# Patient Record
Sex: Female | Born: 2000 | Race: White | Hispanic: No | Marital: Single | Smoking: Never smoker
Health system: Southern US, Community
[De-identification: ages and names within clinical notes are randomized; demographics above are authoritative.]

## PROBLEM LIST (undated history)

## (undated) DIAGNOSIS — R51 Headache: Secondary | ICD-10-CM

## (undated) HISTORY — DX: Headache: R51

---
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
## 2020-03-20 DIAGNOSIS — F39 Unspecified mood [affective] disorder: Secondary | ICD-10-CM | POA: Diagnosis not present

## 2020-03-20 DIAGNOSIS — Z6831 Body mass index (BMI) 31.0-31.9, adult: Secondary | ICD-10-CM | POA: Diagnosis not present

## 2020-03-20 DIAGNOSIS — Z309 Encounter for contraceptive management, unspecified: Secondary | ICD-10-CM | POA: Diagnosis not present

## 2020-03-20 DIAGNOSIS — Z0000 Encounter for general adult medical examination without abnormal findings: Secondary | ICD-10-CM | POA: Diagnosis not present

## 2020-04-16 DIAGNOSIS — F411 Generalized anxiety disorder: Secondary | ICD-10-CM | POA: Diagnosis not present

## 2020-04-23 DIAGNOSIS — F411 Generalized anxiety disorder: Secondary | ICD-10-CM | POA: Diagnosis not present

## 2020-04-30 DIAGNOSIS — F411 Generalized anxiety disorder: Secondary | ICD-10-CM | POA: Diagnosis not present

## 2020-05-07 DIAGNOSIS — F411 Generalized anxiety disorder: Secondary | ICD-10-CM | POA: Diagnosis not present

## 2020-05-16 DIAGNOSIS — F411 Generalized anxiety disorder: Secondary | ICD-10-CM | POA: Diagnosis not present

## 2020-05-21 DIAGNOSIS — F411 Generalized anxiety disorder: Secondary | ICD-10-CM | POA: Diagnosis not present

## 2020-05-29 DIAGNOSIS — F411 Generalized anxiety disorder: Secondary | ICD-10-CM | POA: Diagnosis not present

## 2020-06-09 DIAGNOSIS — F411 Generalized anxiety disorder: Secondary | ICD-10-CM | POA: Diagnosis not present

## 2020-06-12 DIAGNOSIS — F411 Generalized anxiety disorder: Secondary | ICD-10-CM | POA: Diagnosis not present

## 2020-06-19 DIAGNOSIS — F411 Generalized anxiety disorder: Secondary | ICD-10-CM | POA: Diagnosis not present

## 2020-07-03 DIAGNOSIS — F411 Generalized anxiety disorder: Secondary | ICD-10-CM | POA: Diagnosis not present

## 2020-07-10 DIAGNOSIS — F411 Generalized anxiety disorder: Secondary | ICD-10-CM | POA: Diagnosis not present

## 2020-07-16 DIAGNOSIS — F411 Generalized anxiety disorder: Secondary | ICD-10-CM | POA: Diagnosis not present

## 2020-07-23 DIAGNOSIS — F411 Generalized anxiety disorder: Secondary | ICD-10-CM | POA: Diagnosis not present

## 2020-07-30 DIAGNOSIS — F411 Generalized anxiety disorder: Secondary | ICD-10-CM | POA: Diagnosis not present

## 2020-08-06 DIAGNOSIS — F411 Generalized anxiety disorder: Secondary | ICD-10-CM | POA: Diagnosis not present

## 2020-08-15 DIAGNOSIS — F411 Generalized anxiety disorder: Secondary | ICD-10-CM | POA: Diagnosis not present

## 2020-08-21 DIAGNOSIS — F411 Generalized anxiety disorder: Secondary | ICD-10-CM | POA: Diagnosis not present

## 2020-08-27 DIAGNOSIS — F411 Generalized anxiety disorder: Secondary | ICD-10-CM | POA: Diagnosis not present

## 2020-09-03 DIAGNOSIS — F411 Generalized anxiety disorder: Secondary | ICD-10-CM | POA: Diagnosis not present

## 2020-09-18 DIAGNOSIS — F411 Generalized anxiety disorder: Secondary | ICD-10-CM | POA: Diagnosis not present

## 2020-10-02 DIAGNOSIS — F411 Generalized anxiety disorder: Secondary | ICD-10-CM | POA: Diagnosis not present

## 2020-10-17 DIAGNOSIS — F330 Major depressive disorder, recurrent, mild: Secondary | ICD-10-CM | POA: Diagnosis not present

## 2020-10-19 DIAGNOSIS — F411 Generalized anxiety disorder: Secondary | ICD-10-CM | POA: Diagnosis not present

## 2020-10-31 DIAGNOSIS — F411 Generalized anxiety disorder: Secondary | ICD-10-CM | POA: Diagnosis not present

## 2020-11-07 DIAGNOSIS — Z6830 Body mass index (BMI) 30.0-30.9, adult: Secondary | ICD-10-CM | POA: Diagnosis not present

## 2020-11-07 DIAGNOSIS — F419 Anxiety disorder, unspecified: Secondary | ICD-10-CM | POA: Diagnosis not present

## 2020-11-07 DIAGNOSIS — F330 Major depressive disorder, recurrent, mild: Secondary | ICD-10-CM | POA: Diagnosis not present

## 2020-11-14 DIAGNOSIS — F411 Generalized anxiety disorder: Secondary | ICD-10-CM | POA: Diagnosis not present

## 2020-11-26 DIAGNOSIS — F411 Generalized anxiety disorder: Secondary | ICD-10-CM | POA: Diagnosis not present

## 2020-12-10 DIAGNOSIS — F411 Generalized anxiety disorder: Secondary | ICD-10-CM | POA: Diagnosis not present

## 2020-12-24 DIAGNOSIS — F411 Generalized anxiety disorder: Secondary | ICD-10-CM | POA: Diagnosis not present

## 2021-01-14 DIAGNOSIS — F411 Generalized anxiety disorder: Secondary | ICD-10-CM | POA: Diagnosis not present

## 2021-03-11 DIAGNOSIS — F419 Anxiety disorder, unspecified: Secondary | ICD-10-CM | POA: Diagnosis not present

## 2021-03-11 DIAGNOSIS — Z23 Encounter for immunization: Secondary | ICD-10-CM | POA: Diagnosis not present

## 2021-03-11 DIAGNOSIS — F330 Major depressive disorder, recurrent, mild: Secondary | ICD-10-CM | POA: Diagnosis not present

## 2021-04-12 DIAGNOSIS — R059 Cough, unspecified: Secondary | ICD-10-CM | POA: Diagnosis not present

## 2021-04-12 DIAGNOSIS — J9801 Acute bronchospasm: Secondary | ICD-10-CM | POA: Diagnosis not present

## 2021-04-12 DIAGNOSIS — J209 Acute bronchitis, unspecified: Secondary | ICD-10-CM | POA: Diagnosis not present

## 2021-09-09 DIAGNOSIS — R5383 Other fatigue: Secondary | ICD-10-CM | POA: Diagnosis not present

## 2021-09-09 DIAGNOSIS — Z131 Encounter for screening for diabetes mellitus: Secondary | ICD-10-CM | POA: Diagnosis not present

## 2021-09-09 DIAGNOSIS — Z13220 Encounter for screening for lipoid disorders: Secondary | ICD-10-CM | POA: Diagnosis not present

## 2021-10-28 DIAGNOSIS — E039 Hypothyroidism, unspecified: Secondary | ICD-10-CM | POA: Diagnosis not present

## 2021-10-29 DIAGNOSIS — F411 Generalized anxiety disorder: Secondary | ICD-10-CM | POA: Diagnosis not present

## 2021-11-21 DIAGNOSIS — F411 Generalized anxiety disorder: Secondary | ICD-10-CM | POA: Diagnosis not present

## 2021-12-16 DIAGNOSIS — E039 Hypothyroidism, unspecified: Secondary | ICD-10-CM | POA: Diagnosis not present

## 2021-12-23 DIAGNOSIS — F411 Generalized anxiety disorder: Secondary | ICD-10-CM | POA: Diagnosis not present

## 2022-01-13 DIAGNOSIS — F411 Generalized anxiety disorder: Secondary | ICD-10-CM | POA: Diagnosis not present

## 2022-02-04 DIAGNOSIS — F411 Generalized anxiety disorder: Secondary | ICD-10-CM | POA: Diagnosis not present

## 2022-03-05 DIAGNOSIS — F411 Generalized anxiety disorder: Secondary | ICD-10-CM | POA: Diagnosis not present

## 2022-03-12 DIAGNOSIS — E039 Hypothyroidism, unspecified: Secondary | ICD-10-CM | POA: Diagnosis not present

## 2022-03-12 DIAGNOSIS — F419 Anxiety disorder, unspecified: Secondary | ICD-10-CM | POA: Diagnosis not present

## 2022-03-12 DIAGNOSIS — F330 Major depressive disorder, recurrent, mild: Secondary | ICD-10-CM | POA: Diagnosis not present

## 2022-03-12 DIAGNOSIS — N926 Irregular menstruation, unspecified: Secondary | ICD-10-CM | POA: Diagnosis not present

## 2022-03-12 DIAGNOSIS — Z23 Encounter for immunization: Secondary | ICD-10-CM | POA: Diagnosis not present

## 2022-03-25 DIAGNOSIS — F411 Generalized anxiety disorder: Secondary | ICD-10-CM | POA: Diagnosis not present

## 2022-04-15 DIAGNOSIS — F411 Generalized anxiety disorder: Secondary | ICD-10-CM | POA: Diagnosis not present

## 2022-05-01 DIAGNOSIS — E039 Hypothyroidism, unspecified: Secondary | ICD-10-CM | POA: Diagnosis not present

## 2022-05-13 DIAGNOSIS — F411 Generalized anxiety disorder: Secondary | ICD-10-CM | POA: Diagnosis not present

## 2022-05-26 DIAGNOSIS — Z20822 Contact with and (suspected) exposure to covid-19: Secondary | ICD-10-CM | POA: Diagnosis not present

## 2022-05-26 DIAGNOSIS — Z3202 Encounter for pregnancy test, result negative: Secondary | ICD-10-CM | POA: Diagnosis not present

## 2022-05-26 DIAGNOSIS — J069 Acute upper respiratory infection, unspecified: Secondary | ICD-10-CM | POA: Diagnosis not present

## 2022-06-05 DIAGNOSIS — F411 Generalized anxiety disorder: Secondary | ICD-10-CM | POA: Diagnosis not present

## 2022-07-03 DIAGNOSIS — F411 Generalized anxiety disorder: Secondary | ICD-10-CM | POA: Diagnosis not present
